# Patient Record
Sex: Male | Born: 1970 | Race: Black or African American | Hispanic: No | Marital: Single | Smoking: Current every day smoker
Health system: Southern US, Community
[De-identification: ages and names within clinical notes are randomized; demographics above are authoritative.]

## PROBLEM LIST (undated history)

## (undated) DIAGNOSIS — K279 Peptic ulcer, site unspecified, unspecified as acute or chronic, without hemorrhage or perforation: Secondary | ICD-10-CM

## (undated) DIAGNOSIS — I2699 Other pulmonary embolism without acute cor pulmonale: Secondary | ICD-10-CM

## (undated) DIAGNOSIS — D7212 Drug rash with eosinophilia and systemic symptoms syndrome: Secondary | ICD-10-CM

## (undated) DIAGNOSIS — F329 Major depressive disorder, single episode, unspecified: Secondary | ICD-10-CM

## (undated) DIAGNOSIS — T50905A Adverse effect of unspecified drugs, medicaments and biological substances, initial encounter: Secondary | ICD-10-CM

## (undated) DIAGNOSIS — N419 Inflammatory disease of prostate, unspecified: Secondary | ICD-10-CM

## (undated) DIAGNOSIS — B89 Unspecified parasitic disease: Secondary | ICD-10-CM

## (undated) DIAGNOSIS — Q141 Congenital malformation of retina: Secondary | ICD-10-CM

## (undated) DIAGNOSIS — L299 Pruritus, unspecified: Secondary | ICD-10-CM

## (undated) DIAGNOSIS — R609 Edema, unspecified: Secondary | ICD-10-CM

## (undated) DIAGNOSIS — G8929 Other chronic pain: Secondary | ICD-10-CM

## (undated) DIAGNOSIS — I10 Essential (primary) hypertension: Secondary | ICD-10-CM

## (undated) DIAGNOSIS — R600 Localized edema: Secondary | ICD-10-CM

## (undated) DIAGNOSIS — D721 Eosinophilia: Secondary | ICD-10-CM

## (undated) DIAGNOSIS — M069 Rheumatoid arthritis, unspecified: Secondary | ICD-10-CM

## (undated) DIAGNOSIS — G259 Extrapyramidal and movement disorder, unspecified: Secondary | ICD-10-CM

## (undated) DIAGNOSIS — R52 Pain, unspecified: Secondary | ICD-10-CM

## (undated) DIAGNOSIS — L270 Generalized skin eruption due to drugs and medicaments taken internally: Secondary | ICD-10-CM

## (undated) DIAGNOSIS — M1990 Unspecified osteoarthritis, unspecified site: Secondary | ICD-10-CM

## (undated) HISTORY — PX: APPENDECTOMY: SHX54

## (undated) HISTORY — PX: KNEE ARTHROSCOPY: SUR90

---

## 2016-07-13 ENCOUNTER — Encounter (HOSPITAL_COMMUNITY): Payer: Self-pay

## 2016-07-13 ENCOUNTER — Emergency Department (HOSPITAL_COMMUNITY)
Admission: EM | Admit: 2016-07-13 | Discharge: 2016-07-13 | Disposition: A | Discharge status: Home / Self Care | Payer: Medicare HMO | Attending: Emergency Medicine | Admitting: Emergency Medicine

## 2016-07-13 DIAGNOSIS — M25551 Pain in right hip: Secondary | ICD-10-CM | POA: Diagnosis present

## 2016-07-13 DIAGNOSIS — F17210 Nicotine dependence, cigarettes, uncomplicated: Secondary | ICD-10-CM | POA: Diagnosis not present

## 2016-07-13 DIAGNOSIS — Z79899 Other long term (current) drug therapy: Secondary | ICD-10-CM | POA: Diagnosis not present

## 2016-07-13 DIAGNOSIS — M5441 Lumbago with sciatica, right side: Secondary | ICD-10-CM | POA: Insufficient documentation

## 2016-07-13 DIAGNOSIS — I10 Essential (primary) hypertension: Secondary | ICD-10-CM | POA: Insufficient documentation

## 2016-07-13 DIAGNOSIS — F28 Other psychotic disorder not due to a substance or known physiological condition: Secondary | ICD-10-CM

## 2016-07-13 DIAGNOSIS — M5431 Sciatica, right side: Secondary | ICD-10-CM

## 2016-07-13 HISTORY — DX: Congenital malformation of retina: Q14.1

## 2016-07-13 HISTORY — DX: Unspecified osteoarthritis, unspecified site: M19.90

## 2016-07-13 HISTORY — DX: Edema, unspecified: R60.9

## 2016-07-13 HISTORY — DX: Other chronic pain: G89.29

## 2016-07-13 HISTORY — DX: Extrapyramidal and movement disorder, unspecified: G25.9

## 2016-07-13 HISTORY — DX: Drug rash with eosinophilia and systemic symptoms syndrome: D72.12

## 2016-07-13 HISTORY — DX: Major depressive disorder, single episode, unspecified: F32.9

## 2016-07-13 HISTORY — DX: Pruritus, unspecified: L29.9

## 2016-07-13 HISTORY — DX: Pain, unspecified: R52

## 2016-07-13 HISTORY — DX: Eosinophilia: D72.1

## 2016-07-13 HISTORY — DX: Unspecified parasitic disease: B89

## 2016-07-13 HISTORY — DX: Other pulmonary embolism without acute cor pulmonale: I26.99

## 2016-07-13 HISTORY — DX: Inflammatory disease of prostate, unspecified: N41.9

## 2016-07-13 HISTORY — DX: Generalized skin eruption due to drugs and medicaments taken internally: L27.0

## 2016-07-13 HISTORY — DX: Adverse effect of unspecified drugs, medicaments and biological substances, initial encounter: T50.905A

## 2016-07-13 HISTORY — DX: Peptic ulcer, site unspecified, unspecified as acute or chronic, without hemorrhage or perforation: K27.9

## 2016-07-13 HISTORY — DX: Rheumatoid arthritis, unspecified: M06.9

## 2016-07-13 HISTORY — DX: Localized edema: R60.0

## 2016-07-13 HISTORY — DX: Essential (primary) hypertension: I10

## 2016-07-13 LAB — CBC
HCT: 47.6 % (ref 39.0–52.0)
HEMOGLOBIN: 16 g/dL (ref 13.0–17.0)
MCH: 30 pg (ref 26.0–34.0)
MCHC: 33.6 g/dL (ref 30.0–36.0)
MCV: 89.3 fL (ref 78.0–100.0)
PLATELETS: 352 10*3/uL (ref 150–400)
RBC: 5.33 MIL/uL (ref 4.22–5.81)
RDW: 14.3 % (ref 11.5–15.5)
WBC: 16.3 10*3/uL — ABNORMAL HIGH (ref 4.0–10.5)

## 2016-07-13 LAB — COMPREHENSIVE METABOLIC PANEL
ALT: 41 U/L (ref 17–63)
AST: 131 U/L — AB (ref 15–41)
Albumin: 4 g/dL (ref 3.5–5.0)
Alkaline Phosphatase: 98 U/L (ref 38–126)
Anion gap: 10 (ref 5–15)
BILIRUBIN TOTAL: 0.6 mg/dL (ref 0.3–1.2)
BUN: 14 mg/dL (ref 6–20)
CHLORIDE: 99 mmol/L — AB (ref 101–111)
CO2: 28 mmol/L (ref 22–32)
CREATININE: 1.32 mg/dL — AB (ref 0.61–1.24)
Calcium: 9.2 mg/dL (ref 8.9–10.3)
Glucose, Bld: 89 mg/dL (ref 65–99)
POTASSIUM: 4.6 mmol/L (ref 3.5–5.1)
SODIUM: 137 mmol/L (ref 135–145)
TOTAL PROTEIN: 8.1 g/dL (ref 6.5–8.1)

## 2016-07-13 LAB — RAPID URINE DRUG SCREEN, HOSP PERFORMED
AMPHETAMINES: NOT DETECTED
BENZODIAZEPINES: NOT DETECTED
Barbiturates: NOT DETECTED
Cocaine: NOT DETECTED
OPIATES: NOT DETECTED
Tetrahydrocannabinol: NOT DETECTED

## 2016-07-13 LAB — ETHANOL

## 2016-07-13 MED ORDER — KETOROLAC TROMETHAMINE 60 MG/2ML IM SOLN: 60.0000 mg | Freq: Once | INTRAMUSCULAR | Status: DC

## 2016-07-13 MED ORDER — ONDANSETRON 8 MG PO TBDP: 8.0000 mg | ORAL_TABLET | Freq: Once | ORAL | Status: DC

## 2016-07-13 MED ORDER — HYDROMORPHONE HCL 1 MG/ML IJ SOLN: 1.0000 mg | Freq: Once | INTRAMUSCULAR | Status: DC

## 2016-07-13 NOTE — ED Provider Notes (Signed)
Time of transfer of care patient was awaiting TTS consult to evaluate for stability of discharge. After consultation they felt this patient was stable for discharge and outpatient follow-up. On my reexamination patient was sleeping comfortably and I woke him up he had no complaints of pain at this time. He did not seem to be responding to internal stimuli at this time. He has no SI/HI. He realized he was in the hospital was slightly disoriented or how long he had been here but otherwise did not seem to be a danger to himself or others. I discussed his elevated AST and his creatinine. He will follow-up with his primary doctor to get these checked and if he does not have one he knows to call the phone number on the paperwork. Also discussed follow-up with [facility] or some other appropriate psychiatric clinic and he agrees to do such.   Marily Memos, MD 07/13/16 [phone]

## 2016-07-13 NOTE — ED Notes (Signed)
ED Provider at bedside. 

## 2016-07-13 NOTE — BH Assessment (Signed)
Tele Assessment Note   Curtis Wade is an 46 y.o. male. Pt states he was seen at hospital due to hip and leg pain.Pt denies SI/HI and AVH. Pt reports Bipolar diagnosis. Pt reports previous hospitalization for SI in 2017. Pt states he is seen by Dr. Sandria Manly for medication management. Pt states he is prescribed Remeron and Seroquel. Pt denies seeing a therapist at this time. Pt denies SA or abuse.   Per Jacki Cones, NP Pt does not meet inpatient criteria. Recommends D/C.  Diagnosis:  F31.81 Bipolar  Past Medical History:  Past Medical History:  Diagnosis Date  . Chronic pain   . Congenital retinal changes   . DRESS syndrome   . Edema, peripheral   . Hypertension   . Major depressive disorder   . Movement disorder   . Osteoarthritis   . Pain   . Parasitic infection   . Peptic ulcer   . Prostatitis   . Pruritus   . Pulmonary embolism (HCC)   . Rheumatoid arthritis [facility])     Past Surgical History:  Procedure Laterality Date  . APPENDECTOMY    . KNEE ARTHROSCOPY      Family History: No family history on file.  Social History:  reports that he has been smoking Cigarettes.  He has been smoking about 0.50 packs per day. He uses smokeless tobacco. He reports that he drinks alcohol. He reports that he does not use drugs.  Additional Social History:  Alcohol / Drug Use Pain Medications: please see mar Prescriptions: please see mar Over the Counter: please see mar History of alcohol / drug use?: No history of alcohol / drug abuse Longest period of sobriety (when/how long): NA  CIWA: CIWA-Ar BP: (!) 126/93 Pulse Rate: 91 COWS:    PATIENT STRENGTHS: (choose at least two) Average or above average intelligence Communication skills  Allergies:  Allergies  Allergen Reactions  . Sulfa Antibiotics Other (See Comments)    Unknown- verified with [facility]    Home Medications:  (Not in a hospital admission)  OB/GYN Status:  No LMP for male patient.  General Assessment  Data Location of Assessment: [facility] TTS Assessment: In system Is this a Tele or Face-to-Face Assessment?: Tele Assessment Is this an Initial Assessment or a Re-assessment for this encounter?: Initial Assessment Marital status: Single Maiden name: NA Is patient pregnant?: No Pregnancy Status: No     Crisis Care Plan Legal Guardian: Other: (self) Name of Psychiatrist: NA Name of Therapist: NA     Risk to self with the past 6 months Suicidal Ideation: No Has patient been a risk to self within the past 6 months prior to admission? : No Suicidal Intent: No Has patient had any suicidal intent within the past 6 months prior to admission? : No Is patient at risk for suicide?: No Suicidal Plan?: No Has patient had any suicidal plan within the past 6 months prior to admission? : No Access to Means: No What has been your use of drugs/alcohol within the last 12 months?: NA Previous Attempts/Gestures: No How many times?: 0 Other Self Harm Risks: NA Triggers for Past Attempts: None known Intentional Self Injurious Behavior: None Family Suicide History: No Recent stressful life event(s): Other (Comment) (none reported) Persecutory voices/beliefs?: No Depression: Yes Depression Symptoms: Loss of interest in usual pleasures, Fatigue, Tearfulness Substance abuse history and/or treatment for substance abuse?: No Suicide prevention information given to non-admitted patients: Not applicable  Risk to Others within the past 6 months Homicidal Ideation: No Does  patient have any lifetime risk of violence toward others beyond the six months prior to admission? : No Thoughts of Harm to Others: No Current Homicidal Intent: No Current Homicidal Plan: No Access to Homicidal Means: No Identified Victim: NA History of harm to others?: No Assessment of Violence: None Noted Violent Behavior Description: NA Does patient have access to weapons?: No Criminal Charges Pending?: No Does patient have a  court date: No Is patient on probation?: No  Psychosis Hallucinations: None noted Delusions: None noted  Mental Status Report Appearance/Hygiene: Unremarkable Eye Contact: Fair Motor Activity: Freedom of movement Speech: Logical/coherent Level of Consciousness: Alert Mood: Euthymic Affect: Appropriate to circumstance Anxiety Level: Minimal Thought Processes: Coherent, Relevant Judgement: Unimpaired Orientation: Person, Place, Time, Situation Obsessive Compulsive Thoughts/Behaviors: None  Cognitive Functioning Concentration: Normal Memory: Recent Intact, Remote Intact IQ: Average Insight: Fair Impulse Control: Fair Appetite: Fair Weight Loss: 0 Weight Gain: 0 Sleep: Decreased Total Hours of Sleep: 6 Vegetative Symptoms: None  ADLScreening [facility] Assessment Services) Patient's cognitive ability adequate to safely complete daily activities?: Yes Patient able to express need for assistance with ADLs?: Yes Independently performs ADLs?: Yes (appropriate for developmental age)  Prior Inpatient Therapy Prior Inpatient Therapy: Yes Prior Therapy Dates: 2017 Prior Therapy Facilty/Provider(s): [facility]  Reason for Treatment: bipolar  Prior Outpatient Therapy Prior Outpatient Therapy: Yes Prior Therapy Dates: current Prior Therapy Facilty/Provider(s): Dr. Sandria Manly Reason for Treatment: bipolar Does patient have an ACCT team?: No Does patient have Intensive In-House Services?  : No Does patient have [facility] services? : No Does patient have P4CC services?: No  ADL Screening (condition at time of admission) Patient's cognitive ability adequate to safely complete daily activities?: Yes Is the patient deaf or have difficulty hearing?: No Does the patient have difficulty seeing, even when wearing glasses/contacts?: No Does the patient have difficulty concentrating, remembering, or making decisions?: No Patient able to express need for assistance with ADLs?: Yes Does the patient have  difficulty dressing or bathing?: No Independently performs ADLs?: Yes (appropriate for developmental age) Does the patient have difficulty walking or climbing stairs?: No Weakness of Legs: None Weakness of Arms/Hands: None       Abuse/Neglect Assessment (Assessment to be complete while patient is alone) Physical Abuse: Denies Verbal Abuse: Denies Sexual Abuse: Denies Exploitation of patient/patient's resources: Denies Self-Neglect: Denies     Merchant navy officer (For Healthcare) Does Patient Have a Medical Advance Directive?: No    Additional Information 1:1 In Past 12 Months?: No CIRT Risk: No Elopement Risk: No Does patient have medical clearance?: Yes     Disposition:  Disposition Initial Assessment Completed for this Encounter: Yes Disposition of Patient: Outpatient treatment Type of outpatient treatment: Adult  Nili Honda D 07/13/2016 9:45 AM

## 2016-07-13 NOTE — ED Notes (Addendum)
Pt can state name and date of birth. Pt rambling about events of night regarding EMS being called and says two cops were at his house as well but he didn't know why. Pt has moments during conversation where thought process appears altered. During conversation pt will ramble sentences that do not make sense or have anything to do with his chief complaint for coming to the hospital. For example, pt started talking about football and the [employer] while nurse attempted assess pt complaint of hip pain. Dr Blinda Leatherwood aware. Pt signed release of medical information to attemept to get  Medication list and allergy list from [facility], as pt says "they know his history, meds, and allergies. Pt unable to confirm allergies to medications or medications that he is currently taking.

## 2016-07-13 NOTE — ED Provider Notes (Addendum)
[facility] Provider Note   CSN: [id number] Arrival date & time: 07/13/16  3009     History   Chief Complaint Chief Complaint  Patient presents with  . Hip Pain    HPI Curtis Wade is a 46 y.o. male.  Patient presents to the ER for evaluation of hip pain. Patient reports that he started feeling some pain in the right hip area yesterday. When he woke up this morning, however, he was having sharp pain from the right hip all the way down to the toes on the right leg. Pain worsens with any movement. He had difficulty getting out of bed because of the pain. He denies any direct injury. No change in bowel or bladder function.      Past Medical History:  Diagnosis Date  . DRESS syndrome     There are no active problems to display for this patient.   Past Surgical History:  Procedure Laterality Date  . APPENDECTOMY    . KNEE ARTHROSCOPY         Home Medications    Prior to Admission medications   Not on File    Family History No family history on file.  Social History Social History  Substance Use Topics  . Smoking status: Current Every Day Smoker    Packs/day: 0.50    Types: Cigarettes  . Smokeless tobacco: Current User  . Alcohol use Yes     Allergies   Patient has no allergy information on record.   Review of Systems Review of Systems  Musculoskeletal: Positive for back pain.  All other systems reviewed and are negative.    Physical Exam Updated Vital Signs BP 115/77 (BP Location: Left Arm)   Pulse (!) 103   Temp 98.8 F (37.1 C) (Oral)   Resp 19   Ht 6' (1.829 m)   Wt 157 lb (71.2 kg)   SpO2 100%   BMI 21.29 kg/m   Physical Exam  Constitutional: He is oriented to person, place, and time. He appears well-developed and well-nourished. No distress.  HENT:  Head: Normocephalic and atraumatic.  Right Ear: Hearing normal.  Left Ear: Hearing normal.  Nose: Nose normal.  Mouth/Throat: Oropharynx is clear and moist and mucous  membranes are normal.  Eyes: Conjunctivae and EOM are normal. Pupils are equal, round, and reactive to light.  Neck: Normal range of motion. Neck supple.  Cardiovascular: Regular rhythm, S1 normal and S2 normal.  Exam reveals no gallop and no friction rub.   No murmur heard. Pulmonary/Chest: Effort normal and breath sounds normal. No respiratory distress. He exhibits no tenderness.  Abdominal: Soft. Normal appearance and bowel sounds are normal. There is no hepatosplenomegaly. There is no tenderness. There is no rebound, no guarding, no tenderness at McBurney's point and negative Murphy's sign. No hernia.  Musculoskeletal: Normal range of motion.       Lumbar back: He exhibits tenderness.       Back:  Normal strength and sensation in both lower extremities.  Positive straight leg raise on right  No foot drop  No saddle anesthesia  Neurological: He is alert and oriented to person, place, and time. He has normal strength. No cranial nerve deficit or sensory deficit. Coordination normal. GCS eye subscore is 4. GCS verbal subscore is 5. GCS motor subscore is 6.  Reflex Scores:      Patellar reflexes are 2+ on the right side and 2+ on the left side. Skin: Skin is warm, dry and intact. No rash  noted. No cyanosis.  Psychiatric: He has a normal mood and affect. His speech is normal and behavior is normal. Thought content normal.  Nursing note and vitals reviewed.    ED Treatments / Results  Labs (all labs ordered are listed, but only abnormal results are displayed) Labs Reviewed  CBC  COMPREHENSIVE METABOLIC PANEL  ETHANOL  RAPID URINE DRUG SCREEN, HOSP PERFORMED    EKG  EKG Interpretation None       Radiology No results found.  Procedures Procedures (including critical care time)  Medications Ordered in ED Medications - No data to display   Initial Impression / Assessment and Plan / ED Course  I have reviewed the triage vital signs and the nursing notes.  Pertinent  labs & imaging results that were available during my care of the patient were reviewed by me and considered in my medical decision making (see chart for details).     Patient presents to the ER with musculoskeletal back pain. Examination reveals back tenderness without any associated neurologic findings. Patient's strength, sensation and reflexes were normal. There is no evidence of saddle anesthesia. Patient does not have a foot drop. Patient has not experienced any change in bowel or bladder function. As such, patient did not require any imaging or further studies. Pain does radiate down his right leg, consistent with radiculopathy/sciatica. Patient records indicate that he has a history of DRESS syndrome. He does not know which medication caused his significant reaction, however. He indicates that it is something he took for pain or arthritis in the past. He reports that he has records at [facility]. Records from there indicate that he has an allergy to sulfa. No other known allergies according to them, but he has not been seen in 2 years. [place] [facility] records indicate that most of his visits or for psychiatric illness.  Patient with a strange affect. He is noted to be talking to himself when no one is in the room. He denies hallucinations. He is having difficulty, however, giving simple information about his history. He appears very disorganized. Will obtain behavioral health evaluation.  Final Clinical Impressions(s) / ED Diagnoses   Final diagnoses:  Sciatica of right side  Other psychotic disorder not due to substance or known physiological condition    New Prescriptions New Prescriptions   No medications on file     Gilda Crease, MD 07/13/16 1505    Gilda Crease, MD 07/13/16 [phone]

## 2016-07-13 NOTE — ED Triage Notes (Signed)
Patient complaining of bi-lateral hip and right leg pain that started last night.  Patient states that the pain woke him up this morning.

## 2016-08-26 ENCOUNTER — Emergency Department (HOSPITAL_COMMUNITY): Payer: Medicare HMO

## 2016-08-26 ENCOUNTER — Encounter (HOSPITAL_COMMUNITY): Payer: Self-pay | Admitting: Emergency Medicine

## 2016-08-26 ENCOUNTER — Emergency Department (HOSPITAL_COMMUNITY)
Admission: EM | Admit: 2016-08-26 | Discharge: 2016-08-27 | Disposition: A | Discharge status: Home / Self Care | Payer: Medicare HMO | Attending: Emergency Medicine | Admitting: Emergency Medicine

## 2016-08-26 DIAGNOSIS — I10 Essential (primary) hypertension: Secondary | ICD-10-CM | POA: Insufficient documentation

## 2016-08-26 DIAGNOSIS — Z79899 Other long term (current) drug therapy: Secondary | ICD-10-CM | POA: Insufficient documentation

## 2016-08-26 DIAGNOSIS — R112 Nausea with vomiting, unspecified: Secondary | ICD-10-CM | POA: Insufficient documentation

## 2016-08-26 DIAGNOSIS — R0789 Other chest pain: Secondary | ICD-10-CM | POA: Diagnosis not present

## 2016-08-26 DIAGNOSIS — R0602 Shortness of breath: Secondary | ICD-10-CM | POA: Insufficient documentation

## 2016-08-26 DIAGNOSIS — R079 Chest pain, unspecified: Secondary | ICD-10-CM

## 2016-08-26 DIAGNOSIS — F17210 Nicotine dependence, cigarettes, uncomplicated: Secondary | ICD-10-CM | POA: Insufficient documentation

## 2016-08-26 LAB — CBC WITH DIFFERENTIAL/PLATELET
Basophils Absolute: 0 10*3/uL (ref 0.0–0.1)
Basophils Relative: 0 %
Eosinophils Absolute: 0.1 10*3/uL (ref 0.0–0.7)
Eosinophils Relative: 1 %
HCT: 43.7 % (ref 39.0–52.0)
Hemoglobin: 15.1 g/dL (ref 13.0–17.0)
Lymphocytes Relative: 43 %
Lymphs Abs: 3.6 10*3/uL (ref 0.7–4.0)
MCH: 30.3 pg (ref 26.0–34.0)
MCHC: 34.6 g/dL (ref 30.0–36.0)
MCV: 87.6 fL (ref 78.0–100.0)
Monocytes Absolute: 0.5 10*3/uL (ref 0.1–1.0)
Monocytes Relative: 6 %
Neutro Abs: 4.2 10*3/uL (ref 1.7–7.7)
Neutrophils Relative %: 50 %
Platelets: 294 10*3/uL (ref 150–400)
RBC: 4.99 MIL/uL (ref 4.22–5.81)
RDW: 14.1 % (ref 11.5–15.5)
WBC: 8.4 10*3/uL (ref 4.0–10.5)

## 2016-08-26 LAB — BASIC METABOLIC PANEL
Anion gap: 13 (ref 5–15)
BUN: 10 mg/dL (ref 6–20)
CO2: 23 mmol/L (ref 22–32)
Calcium: 9.3 mg/dL (ref 8.9–10.3)
Chloride: 104 mmol/L (ref 101–111)
Creatinine, Ser: 0.83 mg/dL (ref 0.61–1.24)
GFR calc Af Amer: >60 mL/min (ref >60)
GFR calc non Af Amer: >60 mL/min (ref >60)
Glucose, Bld: 89 mg/dL (ref 65–99)
Potassium: 3.3 mmol/L — ABNORMAL LOW (ref 3.5–5.1)
Sodium: 140 mmol/L (ref 135–145)

## 2016-08-26 LAB — TROPONIN I: Troponin I: <0.03 ng/mL (ref <0.03)

## 2016-08-26 MED ORDER — IOPAMIDOL (ISOVUE-370) INJECTION 76%
100.0000 mL | Freq: Once | INTRAVENOUS | Status: AC | PRN
  Administered 2016-08-26: 100 mL via INTRAVENOUS

## 2016-08-26 MED ORDER — MORPHINE SULFATE (PF) 4 MG/ML IV SOLN
6.0000 mg | Freq: Once | INTRAVENOUS | Status: AC
  Administered 2016-08-26: 6 mg via INTRAVENOUS
  Filled 2016-08-26: qty 2

## 2016-08-26 MED ORDER — KETOROLAC TROMETHAMINE 30 MG/ML IJ SOLN
15.0000 mg | Freq: Once | INTRAMUSCULAR | Status: AC
  Administered 2016-08-26: 15 mg via INTRAVENOUS
  Filled 2016-08-26: qty 1

## 2016-08-26 MED ORDER — SODIUM CHLORIDE 0.9 % IV BOLUS (SEPSIS)
1000.0000 mL | Freq: Once | INTRAVENOUS | Status: AC
  Administered 2016-08-26: 1000 mL via INTRAVENOUS

## 2016-08-26 MED ORDER — NAPROXEN 500 MG PO TABS: 500.0000 mg | ORAL_TABLET | Freq: Two times a day (BID) | ORAL | 0 refills | Status: AC

## 2016-08-26 NOTE — ED Triage Notes (Signed)
PT states he has been having right sided chest pain for 2 days that has been getting worse.  Pain worse with exhaling, has hx of PE

## 2016-08-26 NOTE — ED Provider Notes (Signed)
[facility] Provider Note   CSN: [id number] Arrival date & time: 08/26/16  2054 By signing my name below, I, Levon Hedger, attest that this documentation has been prepared under the direction and in the presence of Raeford Razor, MD . Electronically Signed: Levon Hedger, Scribe. 08/26/2016. 9:23 PM.   History   Chief Complaint Chief Complaint  Patient presents with  . Chest Pain   HPI Curtis Wade is a 46 y.o. male with a history of PE, RA, DRESS syndrome, who presents to the Emergency Department complaining of progressively worsening right sided pressure-like chest pain onset four days ago. Chest pain is exacerbated by expiration; no alleviating factors noted. He reports associated SOB, generalized myalgias, nausea, and vomiting. Per pt, he gets "knots" from his muscle cramps and reports the cramping sensation and knots travel upwards. Pt is a smoker. Pt was taken off of Xarelto sometime last year. He denies any fever, unilateral leg pain or swelling, or appetite change.   The history is provided by the patient. No language interpreter was used.    Past Medical History:  Diagnosis Date  . Chronic pain   . Congenital retinal changes   . DRESS syndrome   . Edema, peripheral   . Hypertension   . Major depressive disorder   . Movement disorder   . Osteoarthritis   . Pain   . Parasitic infection   . Peptic ulcer   . Prostatitis   . Pruritus   . Pulmonary embolism (HCC)   . Rheumatoid arthritis (HCC)     There are no active problems to display for this patient.   Past Surgical History:  Procedure Laterality Date  . APPENDECTOMY    . KNEE ARTHROSCOPY        Home Medications    Prior to Admission medications   Medication Sig Start Date End Date Taking? Authorizing Provider  amLODipine (NORVASC) 10 MG tablet Take 20 mg by mouth at bedtime.   Yes [provider]  carvedilol (COREG) 25 MG tablet Take 25 mg by mouth daily. 02/26/15  Yes [provider]  clonazePAM (KLONOPIN) 1 MG tablet Take 2 mg by mouth at bedtime. 12/28/12  Yes [provider]  nortriptyline (PAMELOR) 10 MG capsule Take 10-50 mg by mouth at bedtime.   Yes [provider]  divalproex (DEPAKOTE) 500 MG DR tablet Take 1,000 mg by mouth at bedtime. 06/15/16   [provider]  gabapentin (NEURONTIN) 400 MG capsule Take 4 capsules by mouth at bedtime. 06/15/16   [provider]  mirtazapine (REMERON) 30 MG tablet Take 60 mg by mouth at bedtime. 07/06/16   [provider]    Family History No family history on file.  Social History Social History  Substance Use Topics  . Smoking status: Current Every Day Smoker    Packs/day: 0.50    Types: Cigarettes  . Smokeless tobacco: Current User  . Alcohol use Yes     Allergies   Sulfa antibiotics   Review of Systems Review of Systems  Constitutional: Negative for appetite change and fever.  Respiratory: Positive for shortness of breath.   Cardiovascular: Positive for chest pain. Negative for leg swelling.  Gastrointestinal: Positive for nausea and vomiting.  Musculoskeletal: Positive for myalgias.  All other systems reviewed and are negative.   Physical Exam Updated Vital Signs BP (!) 125/99 (BP Location: Right Arm) Comment: Simultaneous filing. User may not have seen previous data.  Pulse 85   Temp 98.1 F (36.7 C) (  Oral)   Resp 13   Ht 6' (1.829 m)   Wt 141 lb (64 kg)   SpO2 97%   BMI 19.12 kg/m   Physical Exam  Constitutional: He is oriented to person, place, and time. He appears well-developed and well-nourished.  HENT:  Head: Normocephalic and atraumatic.  Eyes: EOM are normal.  Neck: Normal range of motion.  Cardiovascular: Normal rate, regular rhythm, normal heart sounds and intact distal pulses.   Pulmonary/Chest: Effort normal and breath sounds normal. No respiratory distress.  Abdominal: Soft. He exhibits no distension. There is no  tenderness.  Musculoskeletal: Normal range of motion. He exhibits no edema or tenderness.  No lower extremity edema. No calf tenderness.   Neurological: He is alert and oriented to person, place, and time.  Skin: Skin is warm and dry.  Psychiatric: He has a normal mood and affect. Judgment normal.  Nursing note and vitals reviewed.  ED Treatments / Results  DIAGNOSTIC STUDIES:  Oxygen Saturation is 97% on RA, normal by my interpretation.    COORDINATION OF CARE:  9:22 PM Discussed treatment plan with pt at bedside and pt agreed to plan.   Labs (all labs ordered are listed, but only abnormal results are displayed) Labs Reviewed  BASIC METABOLIC PANEL - Abnormal; Notable for the following:       Result Value   Potassium 3.3 (*)    All other components within normal limits  CBC WITH DIFFERENTIAL/PLATELET  TROPONIN I    EKG  EKG Interpretation  Date/Time:  Saturday August 26 2016 20:56:48 EDT Ventricular Rate:  96 PR Interval:    QRS Duration: 93 QT Interval:  360 QTC Calculation: 455 R Axis:   63 Text Interpretation:  Sinus rhythm LAE, consider biatrial enlargement RSR' in V1 or V2, probably normal variant Left ventricular hypertrophy Confirmed by Geoffery Lyons ([id number]) on 08/28/2016 5:57:03 PM       Radiology No results found.   Dg Chest 2 View  Result Date: 08/26/2016 CLINICAL DATA:  Acute onset of right-sided chest pain. Initial encounter. EXAM: CHEST  2 VIEW COMPARISON:  Chest radiograph performed 02/08/2015, and CTA of the chest performed 02/09/2015 FINDINGS: The lungs are well-aerated and clear. There is no evidence of focal opacification, pleural effusion or pneumothorax. The heart is normal in size; the mediastinal contour is within normal limits. No acute osseous abnormalities are seen. IMPRESSION: No acute cardiopulmonary process seen. Electronically Signed   By: Roanna Raider M.D.   On: 08/26/2016 22:14   Ct Angio Chest Pe W Or Wo Contrast  Result Date:  08/26/2016 CLINICAL DATA:  Worsening right-sided chest pressure EXAM: CT ANGIOGRAPHY CHEST WITH CONTRAST TECHNIQUE: Multidetector CT imaging of the chest was performed using the standard protocol during bolus administration of intravenous contrast. Multiplanar CT image reconstructions and MIPs were obtained to evaluate the vascular anatomy. CONTRAST:  100 mL Isovue 370 intravenous COMPARISON:  Radiograph 08/26/2016, CT chest 02/09/2015 FINDINGS: Cardiovascular: Satisfactory opacification of the pulmonary arteries to the segmental level. No evidence of pulmonary embolism. Normal heart size. No pericardial effusion. Non aneurysmal aorta. Minimal atherosclerotic calcifications. Mediastinum/Nodes: No enlarged mediastinal, hilar, or axillary lymph nodes. Thyroid gland, trachea, and esophagus demonstrate no significant findings. Lungs/Pleura: Minimal apical emphysema. Bleb anterior left upper lobe. No consolidation or pleural effusion. No pneumothorax. Upper Abdomen: No acute abnormality. Multiple hypodense renal lesions, probably cysts although some are too small to further characterize. Lobulated contour of the spleen unchanged. Musculoskeletal: No chest wall abnormality. No acute or significant osseous  findings. Review of the MIP images confirms the above findings. IMPRESSION: 1. Negative for acute pulmonary embolus or aortic dissection 2. Mild emphysema.  No acute infiltrate. Aortic Atherosclerosis (ICD10-I70.0) and Emphysema (ICD10-J43.9). Electronically Signed   By: Jasmine Pang M.D.   On: 08/26/2016 23:08    Procedures Procedures (including critical care time)  Medications Ordered in ED Medications - No data to display   Initial Impression / Assessment and Plan / ED Course  I have reviewed the triage vital signs and the nursing notes.  Pertinent labs & imaging results that were available during my care of the patient were reviewed by me and considered in my medical decision making (see chart for  details).    46yM with pleuritic CP. Seem atypical for ACS. Normal trop with symptoms for days. CT w/o PE, dissection or infiltrate. I doubt emergent process. It has been determined that no acute conditions requiring further emergency intervention are present at this time. The patient has been advised of the diagnosis and plan. I reviewed any labs and imaging including any potential incidental findings. We have discussed signs and symptoms that warrant return to the ED and they are listed in the discharge instructions.    Final Clinical Impressions(s) / ED Diagnoses   Final diagnoses:  Chest pain    New Prescriptions New Prescriptions   No medications on file   I personally preformed the services scribed in my presence. The recorded information has been reviewed is accurate. Raeford Razor, MD.    Raeford Razor, MD 09/08/16 [phone]

## 2017-09-29 ENCOUNTER — Encounter (HOSPITAL_COMMUNITY): Payer: Self-pay

## 2017-09-29 ENCOUNTER — Emergency Department (HOSPITAL_COMMUNITY)
Admission: EM | Admit: 2017-09-29 | Discharge: 2017-09-29 | Disposition: A | Discharge status: Home / Self Care | Payer: Self-pay | Attending: Emergency Medicine | Admitting: Emergency Medicine

## 2017-09-29 ENCOUNTER — Emergency Department (HOSPITAL_COMMUNITY): Payer: Self-pay

## 2017-09-29 ENCOUNTER — Other Ambulatory Visit: Payer: Self-pay

## 2017-09-29 DIAGNOSIS — M069 Rheumatoid arthritis, unspecified: Secondary | ICD-10-CM | POA: Insufficient documentation

## 2017-09-29 DIAGNOSIS — R072 Precordial pain: Secondary | ICD-10-CM | POA: Insufficient documentation

## 2017-09-29 DIAGNOSIS — F17210 Nicotine dependence, cigarettes, uncomplicated: Secondary | ICD-10-CM | POA: Insufficient documentation

## 2017-09-29 DIAGNOSIS — Z86711 Personal history of pulmonary embolism: Secondary | ICD-10-CM | POA: Insufficient documentation

## 2017-09-29 DIAGNOSIS — Z79899 Other long term (current) drug therapy: Secondary | ICD-10-CM | POA: Insufficient documentation

## 2017-09-29 LAB — CBC
HEMATOCRIT: 41.1 % (ref 39.0–52.0)
HEMOGLOBIN: 13.9 g/dL (ref 13.0–17.0)
MCH: 30.3 pg (ref 26.0–34.0)
MCHC: 33.8 g/dL (ref 30.0–36.0)
MCV: 89.5 fL (ref 78.0–100.0)
Platelets: 309 10*3/uL (ref 150–400)
RBC: 4.59 MIL/uL (ref 4.22–5.81)
RDW: 13.9 % (ref 11.5–15.5)
WBC: 12.1 10*3/uL — AB (ref 4.0–10.5)

## 2017-09-29 LAB — I-STAT CHEM 8, ED
BUN: 11 mg/dL (ref 6–20)
CHLORIDE: 103 mmol/L (ref 98–111)
CREATININE: 0.8 mg/dL (ref 0.61–1.24)
Calcium, Ion: 1.18 mmol/L (ref 1.15–1.40)
Glucose, Bld: 101 mg/dL — ABNORMAL HIGH (ref 70–99)
HEMATOCRIT: 42 % (ref 39.0–52.0)
Hemoglobin: 14.3 g/dL (ref 13.0–17.0)
POTASSIUM: 4 mmol/L (ref 3.5–5.1)
SODIUM: 141 mmol/L (ref 135–145)
TCO2: 25 mmol/L (ref 22–32)

## 2017-09-29 LAB — I-STAT TROPONIN, ED: Troponin i, poc: 0 ng/mL (ref 0.00–0.08)

## 2017-09-29 LAB — VALPROIC ACID LEVEL: Valproic Acid Lvl: <10 ug/mL — ABNORMAL LOW (ref 50.0–100.0)

## 2017-09-29 MED ORDER — IBUPROFEN 400 MG PO TABS: 400.0000 mg | ORAL_TABLET | Freq: Four times a day (QID) | ORAL | 0 refills | Status: AC | PRN

## 2017-09-29 MED ORDER — IOPAMIDOL (ISOVUE-370) INJECTION 76%
INTRAVENOUS | Status: AC
  Filled 2017-09-29: qty 100

## 2017-09-29 MED ORDER — IOPAMIDOL (ISOVUE-370) INJECTION 76%
100.0000 mL | Freq: Once | INTRAVENOUS | Status: AC | PRN
  Administered 2017-09-29: 100 mL via INTRAVENOUS

## 2017-09-29 MED ORDER — KETOROLAC TROMETHAMINE 30 MG/ML IJ SOLN
15.0000 mg | Freq: Once | INTRAMUSCULAR | Status: AC
  Administered 2017-09-29: 15 mg via INTRAVENOUS
  Filled 2017-09-29: qty 1

## 2017-09-29 NOTE — ED Triage Notes (Signed)
Pt arrives today by [facility] Department. Pt is c/o chest pain concentrated in the center and edema in bilateral lower extremities.

## 2017-09-29 NOTE — ED Provider Notes (Signed)
[facility] Provider Note   CSN: [id number] Arrival date & time: 09/29/17  6387     History   Chief Complaint Chief Complaint  Patient presents with  . Chest Pain    HPI Curtis Wade is a 47 y.o. male.  The history is provided by the patient.  Chest Pain   This is a recurrent problem. The current episode started 2 days ago. The problem occurs constantly. The problem has not changed since onset.The pain is associated with rest. The pain is present in the lateral region. The pain is moderate. The quality of the pain is described as pleuritic. The pain does not radiate. Duration of episode(s) is 2 days. The symptoms are aggravated by certain positions and deep breathing. Pertinent negatives include no abdominal pain, no back pain, no claudication, no cough, no diaphoresis, no dizziness, no exertional chest pressure, no fever, no headaches, no hemoptysis, no irregular heartbeat, no nausea, no near-syncope, no numbness, no orthopnea, no palpitations, no PND, no sputum production, no syncope and no vomiting. He has tried nothing for the symptoms. The treatment provided no relief. Risk factors include male gender.  Pertinent negatives for past medical history include no MI.  Pertinent negatives for family medical history include: no Marfan's syndrome.  Procedure history is negative for exercise treadmill test.    Past Medical History:  Diagnosis Date  . Chronic pain   . Congenital retinal changes   . DRESS syndrome   . Edema, peripheral   . Hypertension   . Major depressive disorder   . Movement disorder   . Osteoarthritis   . Pain   . Parasitic infection   . Peptic ulcer   . Prostatitis   . Pruritus   . Pulmonary embolism (HCC)   . Rheumatoid arthritis (HCC)     There are no active problems to display for this patient.   Past Surgical History:  Procedure Laterality Date  . APPENDECTOMY    . KNEE ARTHROSCOPY          Home  Medications    Prior to Admission medications   Medication Sig Start Date End Date Taking? Authorizing Provider  amLODipine (NORVASC) 10 MG tablet Take 20 mg by mouth at bedtime.    [provider]  carvedilol (COREG) 25 MG tablet Take 25 mg by mouth daily. 02/26/15   [provider]  clonazePAM (KLONOPIN) 1 MG tablet Take 2 mg by mouth at bedtime. 12/28/12   [provider]  divalproex (DEPAKOTE) 500 MG DR tablet Take 1,000 mg by mouth at bedtime. 06/15/16   [provider]  gabapentin (NEURONTIN) 300 MG capsule Take 1,200 mg by mouth daily.    [provider]  gabapentin (NEURONTIN) 400 MG capsule Take 4 capsules by mouth at bedtime. 06/15/16   [provider]  mirtazapine (REMERON) 30 MG tablet Take 60 mg by mouth at bedtime. 07/06/16   [provider]  naproxen (NAPROSYN) 500 MG tablet Take 1 tablet (500 mg total) by mouth 2 (two) times daily with a meal. 08/26/16   Raeford Razor, MD  nortriptyline (PAMELOR) 10 MG capsule Take 10-50 mg by mouth at bedtime.    [provider]  promethazine (PHENERGAN) 25 MG tablet Take 50 mg by mouth 3 (three) times daily as needed. 06/15/16   [provider]  QUEtiapine (SEROQUEL) 50 MG tablet Take 100 mg by mouth 2 (two) times daily. 03/16/15   [provider]    Family History History  reviewed. No pertinent family history.  Social History Social History   Tobacco Use  . Smoking status: Current Every Day Smoker    Packs/day: 0.50    Types: Cigarettes  . Smokeless tobacco: Current User  Substance Use Topics  . Alcohol use: Yes  . Drug use: No     Allergies   Sulfa antibiotics   Review of Systems Review of Systems  Constitutional: Negative for diaphoresis and fever.  Respiratory: Negative for cough, hemoptysis and sputum production.   Cardiovascular: Positive for chest pain. Negative for palpitations, orthopnea, claudication, leg swelling, syncope, PND and  near-syncope.  Gastrointestinal: Negative for abdominal pain, nausea and vomiting.  Musculoskeletal: Negative for back pain.  Neurological: Negative for dizziness, numbness and headaches.  All other systems reviewed and are negative.    Physical Exam Updated Vital Signs BP (!) 130/95   Pulse 89   Temp 98.8 F (37.1 C) (Oral)   Resp 18   Ht 5\' 11"  (1.803 m)   Wt 68.5 kg (151 lb)   SpO2 100%   BMI 21.06 kg/m   Physical Exam  Constitutional: He is oriented to person, place, and time. He appears well-developed and well-nourished. No distress.  HENT:  Head: Normocephalic and atraumatic.  Mouth/Throat: No oropharyngeal exudate.  Eyes: Pupils are equal, round, and reactive to light. Conjunctivae are normal.  Neck: Normal range of motion.  Cardiovascular: Normal rate, regular rhythm, normal heart sounds and intact distal pulses.  Pulmonary/Chest: Effort normal and breath sounds normal. No stridor. No respiratory distress. He has no wheezes. He has no rales. He exhibits tenderness.  Abdominal: Soft. Bowel sounds are normal. He exhibits no mass. There is no tenderness. There is no rebound and no guarding.  Musculoskeletal: Normal range of motion. He exhibits no tenderness or deformity.  Neurological: He is alert and oriented to person, place, and time.  Skin: Skin is warm and dry.  Psychiatric: He has a normal mood and affect.     ED Treatments / Results  Labs (all labs ordered are listed, but only abnormal results are displayed) Results for orders placed or performed during the hospital encounter of 09/29/17  CBC  Result Value Ref Range   WBC 12.1 (H) 4.0 - 10.5 K/uL   RBC 4.59 4.22 - 5.81 MIL/uL   Hemoglobin 13.9 13.0 - 17.0 g/dL   HCT 72.0 94.7 - 09.6 %   MCV 89.5 78.0 - 100.0 fL   MCH 30.3 26.0 - 34.0 pg   MCHC 33.8 30.0 - 36.0 g/dL   RDW 28.3 66.2 - 94.7 %   Platelets 309 150 - 400 K/uL  I-stat troponin, ED  Result Value Ref Range   Troponin i, poc 0.00 0.00 - 0.08  ng/mL   Comment 3          I-Stat Chem 8, ED  Result Value Ref Range   Sodium 141 135 - 145 mmol/L   Potassium 4.0 3.5 - 5.1 mmol/L   Chloride 103 98 - 111 mmol/L   BUN 11 6 - 20 mg/dL   Creatinine, Ser 6.54 0.61 - 1.24 mg/dL   Glucose, Bld 650 (H) 70 - 99 mg/dL   Calcium, Ion 3.54 6.56 - 1.40 mmol/L   TCO2 25 22 - 32 mmol/L   Hemoglobin 14.3 13.0 - 17.0 g/dL   HCT 81.2 75.1 - 70.0 %   Dg Chest 2 View  Result Date: 09/29/2017 CLINICAL DATA:  47 year old male with chest pain and shortness of breath. EXAM: CHEST -  2 VIEW COMPARISON:  None. FINDINGS: The heart size and mediastinal contours are within normal limits. Both lungs are clear. The visualized skeletal structures are unremarkable. IMPRESSION: No active cardiopulmonary disease. Electronically Signed   By: Elgie Collard M.D.   On: 09/29/2017 04:16    EKG EKG Interpretation  Date/Time:  Saturday September 29 2017 04:03:36 EDT Ventricular Rate:  107 PR Interval:    QRS Duration: 84 QT Interval:  343 QTC Calculation: 458 R Axis:   51 Text Interpretation:  Sinus tachycardia Probable left ventricular hypertrophy Baseline wander in lead(s) III Confirmed by Nicanor Alcon, Tiombe Tomeo ([id number]) on 09/29/2017 4:07:28 AM   Radiology Dg Chest 2 View  Result Date: 09/29/2017 CLINICAL DATA:  47 year old male with chest pain and shortness of breath. EXAM: CHEST - 2 VIEW COMPARISON:  None. FINDINGS: The heart size and mediastinal contours are within normal limits. Both lungs are clear. The visualized skeletal structures are unremarkable. IMPRESSION: No active cardiopulmonary disease. Electronically Signed   By: Elgie Collard M.D.   On: 09/29/2017 04:16    Procedures Procedures (including critical care time)  Medications Ordered in ED Medications  iopamidol (ISOVUE-370) 76 % injection (has no administration in time range)  ketorolac (TORADOL) 30 MG/ML injection 15 mg (has no administration in time range)  iopamidol (ISOVUE-370) 76 % injection 100  mL (100 mLs Intravenous Contrast Given 09/29/17 0519)       Final Clinical Impressions(s) / ED Diagnoses   Pain is reproducible, consistent with MSK pain.  Ruled out for PE and MI in the ED given the time course as patient has had > 24 hours of continuous pain.  HEART score 1 very low risk for MACE.   Return for pain, numbness, changes in vision or speech, fevers >100.4 unrelieved by medication, shortness of breath, intractable vomiting, or diarrhea, abdominal pain, Inability to tolerate liquids or food, cough, altered mental status or any concerns. No signs of systemic illness or infection. The patient is nontoxic-appearing on exam and vital signs are within normal limits. Will refer to urology for microscopy hematuria as patient is asymptomatic.  I have reviewed the triage vital signs and the nursing notes. Pertinent labs &imaging results that were available during my care of the patient were reviewed by me and considered in my medical decision making (see chart for details).  After history, exam, and medical workup I feel the patient has been appropriately medically screened and is safe for discharge home. Pertinent diagnoses were discussed with the patient. Patient was given return precautions.   Erum Cercone, MD 09/29/17 [phone]

## 2019-02-08 IMAGING — CT CT ANGIO CHEST
2 of 7 series · 19 of 46 positions shown · IV contrast (ISOVUE)
Comparison: Chest radiograph dated 09/29/2017

CLINICAL DATA: 47-year-old male with chest pain. Concern for
pulmonary embolism.

EXAM:
CT ANGIOGRAPHY CHEST WITH CONTRAST
TECHNIQUE: Multidetector CT imaging of the chest was performed using the
standard protocol during bolus administration of intravenous
contrast. Multiplanar CT image reconstructions and MIPs were
obtained to evaluate the vascular anatomy.
CONTRAST:  100mL 2BRYQR-69O IOPAMIDOL (2BRYQR-69O) INJECTION 76%

[Series 5: thins · axial · 0.71mm/px · z∈[+1211,+1489]mm · 16 of 316 slices shown]
[im 19/316  lung]
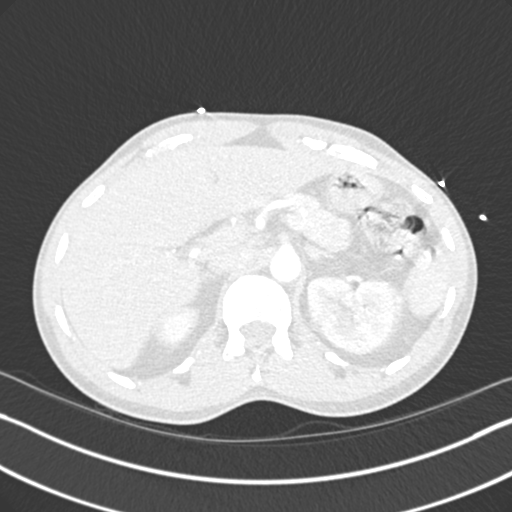
[im 38/316  soft-tissue]
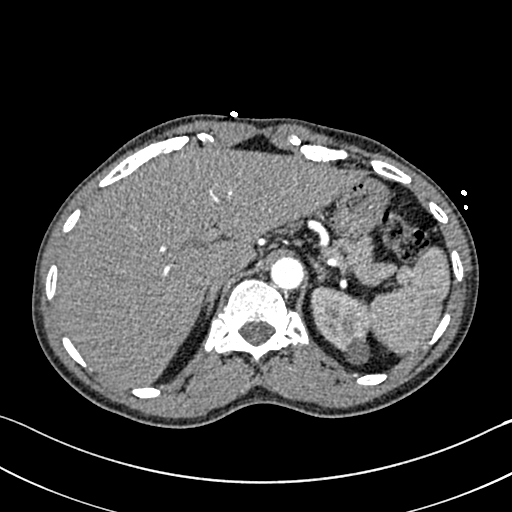
[im 56/316  lung]
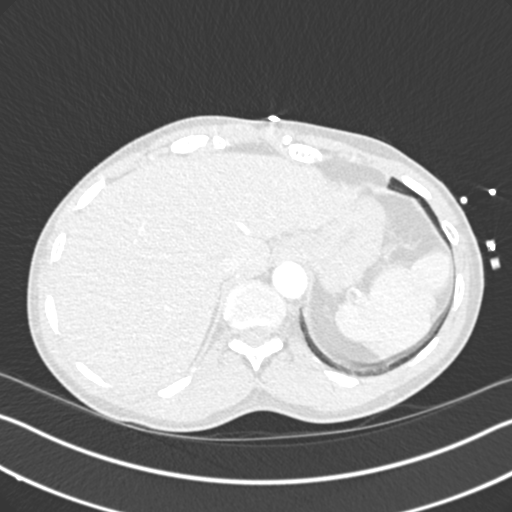
[im 75/316  soft-tissue]
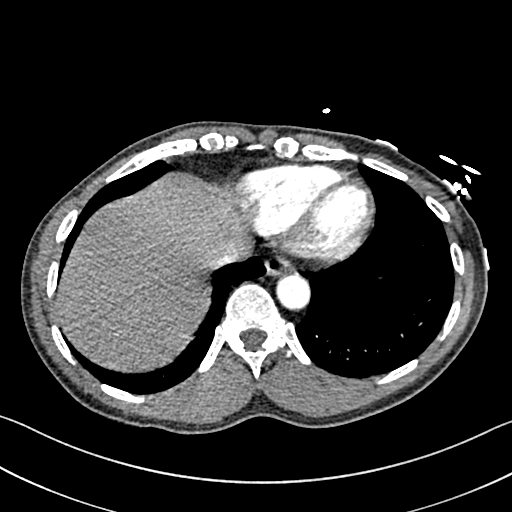
[im 93/316  lung]
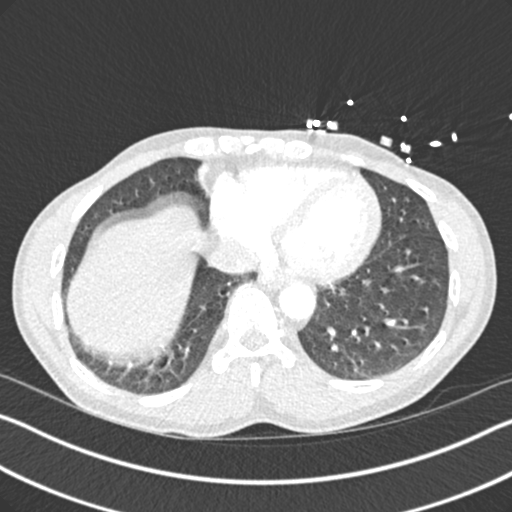
[im 112/316  soft-tissue]
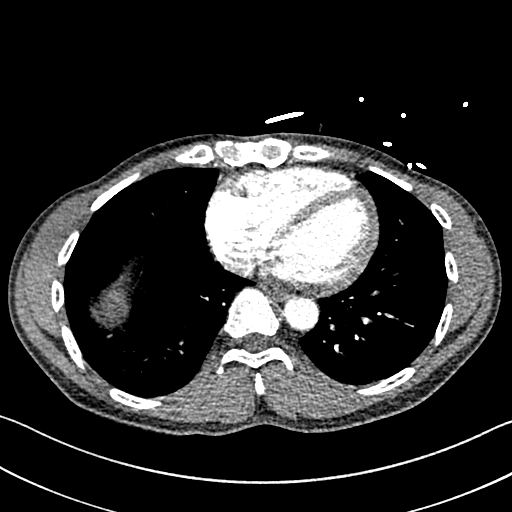
[im 130/316  lung]
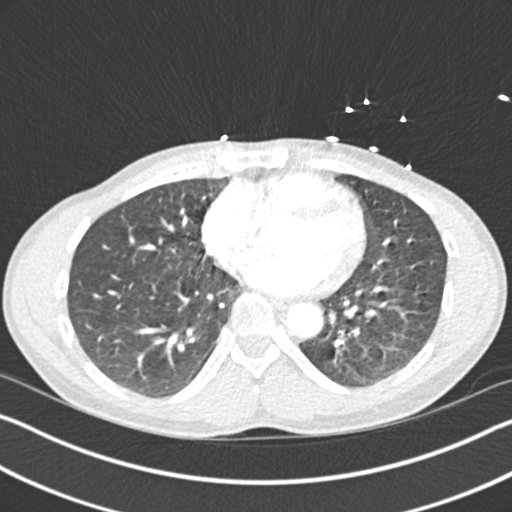
[im 149/316  soft-tissue]
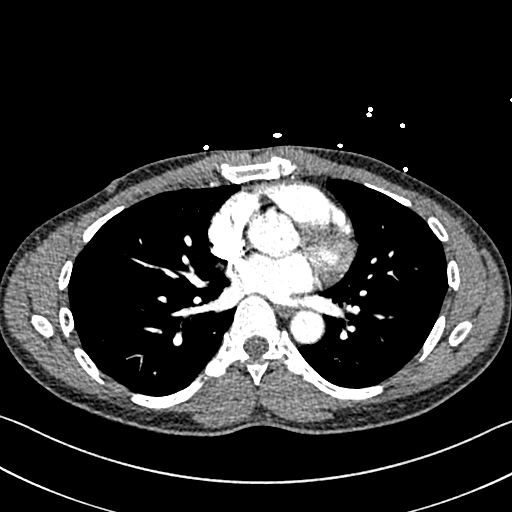
[im 167/316  lung]
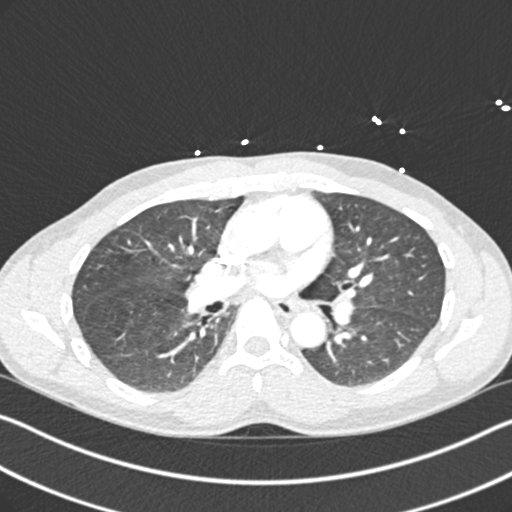
[im 186/316  soft-tissue]
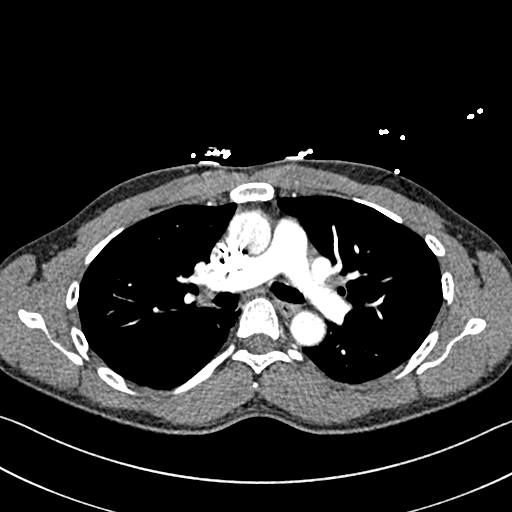
[im 204/316  lung]
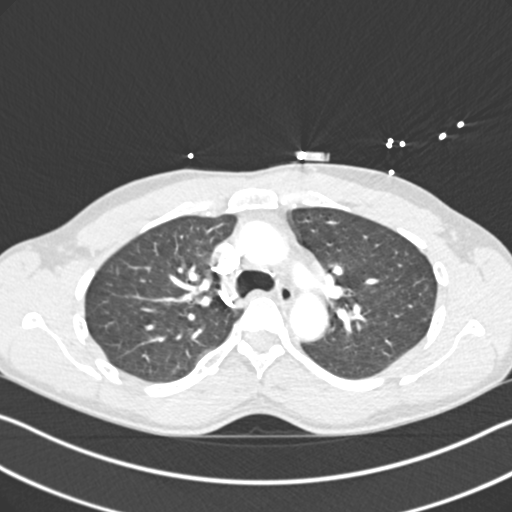
[im 223/316  soft-tissue]
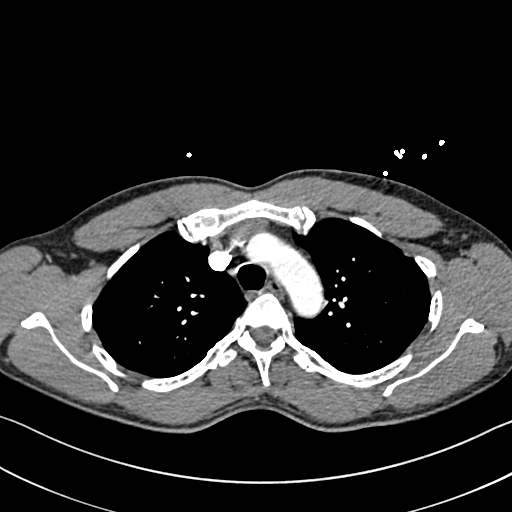
[im 241/316  lung]
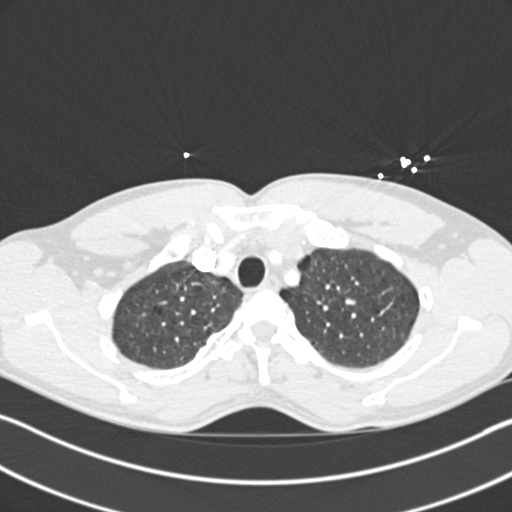
[im 260/316  soft-tissue]
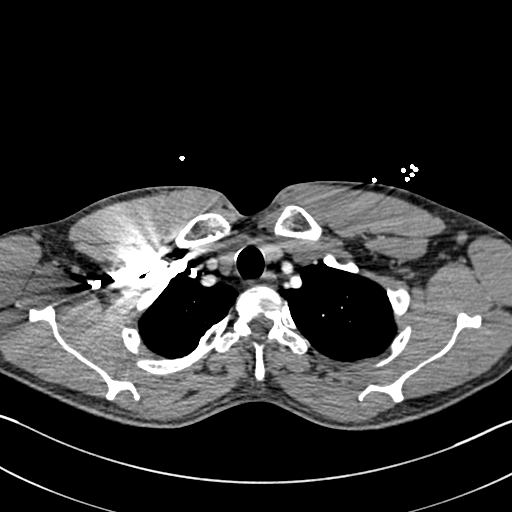
[im 278/316  lung]
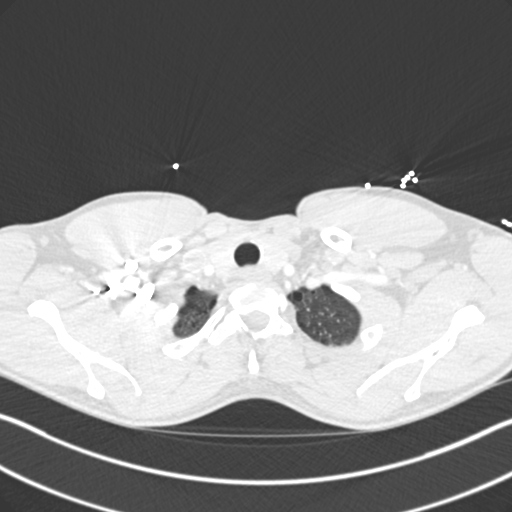
[im 297/316  soft-tissue]
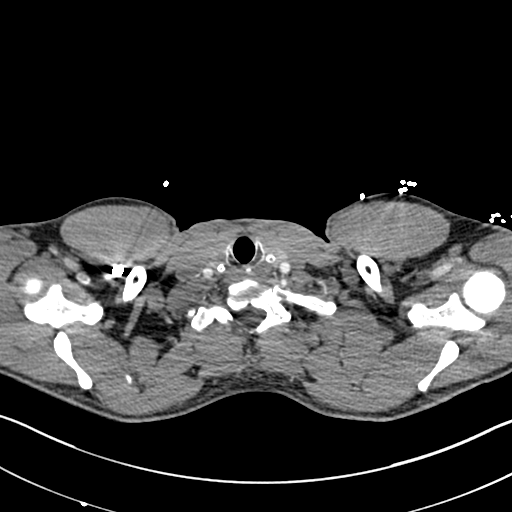

[Series 7: coronal mpr · coronal · 0.64mm/px · 3 of 117 slices shown]
[im 30/117  soft-tissue]
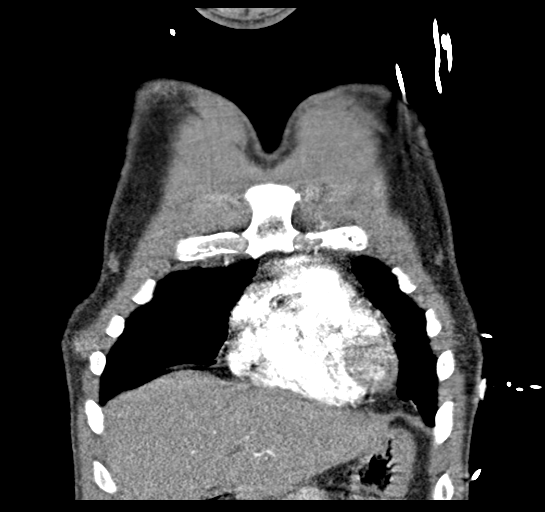
[im 59/117  soft-tissue]
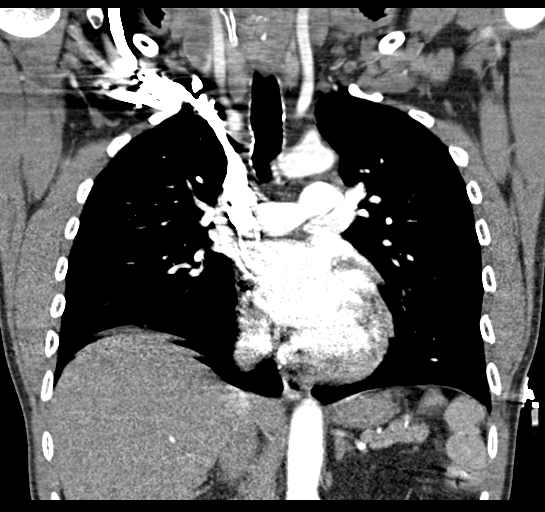
[im 88/117  soft-tissue]
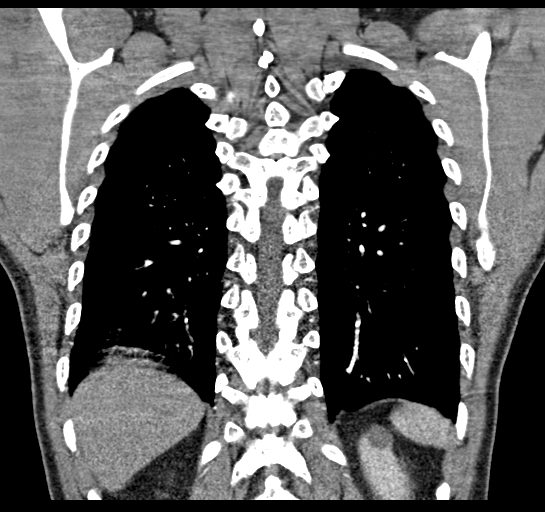

[19 of 46 positions shown; findings below may reference images not displayed]

FINDINGS: Evaluation of this exam is limited due to respiratory motion
artifact.

Cardiovascular: There is no cardiomegaly or pericardial effusion.
The thoracic aorta is unremarkable. Evaluation of the pulmonary
arteries is limited due to respiratory motion artifact and
suboptimal visualization of the peripheral branches. No large or
central pulmonary artery embolus identified.

Mediastinum/Nodes: Top-normal right hilar lymph node measures 10 mm
in short axis. No hilar or mediastinal adenopathy. Esophagus and
thyroid gland are grossly unremarkable. No mediastinal fluid
collection.

Lungs/Pleura: Mild centrilobular emphysema and small left apical
subpleural blebs. There is no focal consolidation, pleural effusion,
or pneumothorax. The central airways are patent.

Upper Abdomen: Lobulated appearance of the spleen. Small left renal
hypodense lesions are not characterized.

Musculoskeletal: No chest wall abnormality. No acute or significant
osseous findings.

Review of the MIP images confirms the above findings.
IMPRESSION: No acute intrathoracic pathology. No CT evidence of large or central
pulmonary artery embolus.

Emphysema ([id number]).
